# Patient Record
Sex: Female | Born: 1960 | Race: White | Hispanic: No | Marital: Married | Smoking: Never smoker
Health system: Southern US, Community
[De-identification: ages and names within clinical notes are randomized; demographics above are authoritative.]

## PROBLEM LIST (undated history)

## (undated) DIAGNOSIS — I471 Supraventricular tachycardia, unspecified: Secondary | ICD-10-CM

## (undated) DIAGNOSIS — K219 Gastro-esophageal reflux disease without esophagitis: Secondary | ICD-10-CM

## (undated) HISTORY — PX: TOTAL ABDOMINAL HYSTERECTOMY W/ BILATERAL SALPINGOOPHORECTOMY: SHX83

## (undated) HISTORY — DX: Supraventricular tachycardia: I47.1

## (undated) HISTORY — DX: Supraventricular tachycardia, unspecified: I47.10

## (undated) HISTORY — DX: Gastro-esophageal reflux disease without esophagitis: K21.9

---

## 2006-02-17 ENCOUNTER — Emergency Department (HOSPITAL_COMMUNITY): Admission: EM | Admit: 2006-02-17 | Discharge: 2006-02-17 | Payer: Self-pay | Admitting: Emergency Medicine

## 2006-03-05 ENCOUNTER — Ambulatory Visit: Payer: Self-pay | Admitting: Cardiology

## 2006-03-13 ENCOUNTER — Encounter: Payer: Self-pay | Admitting: Internal Medicine

## 2006-03-13 ENCOUNTER — Ambulatory Visit: Payer: Self-pay

## 2006-09-02 ENCOUNTER — Ambulatory Visit: Payer: Self-pay | Admitting: Cardiology

## 2007-08-18 ENCOUNTER — Ambulatory Visit: Payer: Self-pay | Admitting: Cardiology

## 2007-11-20 IMAGING — CR DG CHEST 1V PORT
1 series · 1 of 1 positions shown · non-contrast
Comparison: none

HISTORY: Chest pain, tachycardia

PORTABLE CHEST ONE VIEW:
Portable exam 5608 hours without priors for comparison.
Upper normal heart size.
Normal mediastinal contours and pulmonary vascularity.
Lungs clear.
Cardiac monitoring lines project over chest.
Bones unremarkable.

[view not recorded]
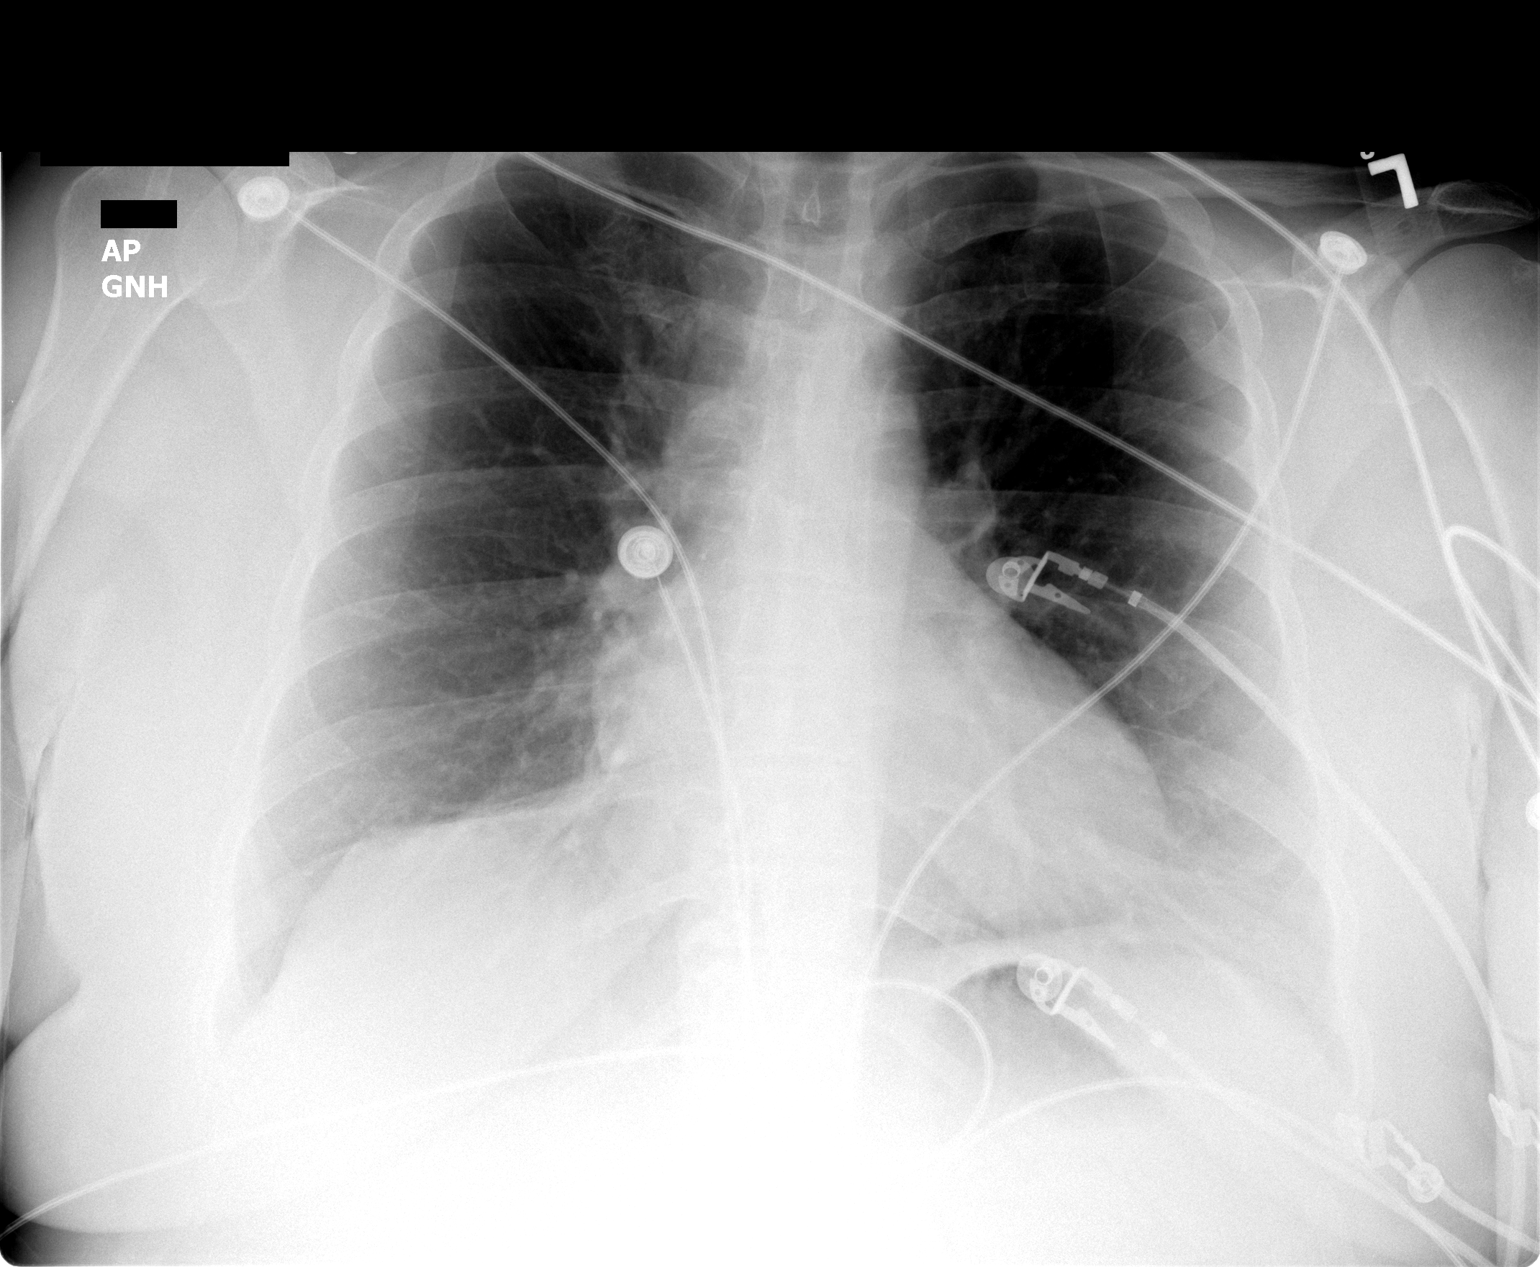

[1 of 1 positions shown; findings below may reference images not displayed]

IMPRESSION: No acute abnormalities.

## 2009-01-31 ENCOUNTER — Encounter (INDEPENDENT_AMBULATORY_CARE_PROVIDER_SITE_OTHER): Payer: Self-pay | Admitting: *Deleted

## 2009-02-03 ENCOUNTER — Ambulatory Visit: Payer: Self-pay | Admitting: Cardiology

## 2009-02-03 DIAGNOSIS — I498 Other specified cardiac arrhythmias: Secondary | ICD-10-CM | POA: Insufficient documentation

## 2009-04-03 ENCOUNTER — Telehealth (INDEPENDENT_AMBULATORY_CARE_PROVIDER_SITE_OTHER): Payer: Self-pay | Admitting: *Deleted

## 2010-04-17 NOTE — Progress Notes (Signed)
  Phone Note Other Incoming   Caller: [facility] Action Taken: Information Sent Initial call taken by: Marijean Niemann EKG to [phone] [facility]  April 03, 2009 11:49 AM

## 2010-06-06 ENCOUNTER — Emergency Department (HOSPITAL_COMMUNITY): Payer: BC Managed Care – PPO

## 2010-06-06 ENCOUNTER — Encounter (INDEPENDENT_AMBULATORY_CARE_PROVIDER_SITE_OTHER): Payer: BC Managed Care – PPO

## 2010-06-06 ENCOUNTER — Emergency Department (HOSPITAL_COMMUNITY)
Admission: EM | Admit: 2010-06-06 | Discharge: 2010-06-06 | Disposition: A | Discharge status: Home / Self Care | Payer: BC Managed Care – PPO | Attending: Emergency Medicine | Admitting: Emergency Medicine

## 2010-06-06 ENCOUNTER — Telehealth: Payer: Self-pay | Admitting: Cardiology

## 2010-06-06 DIAGNOSIS — R110 Nausea: Secondary | ICD-10-CM | POA: Insufficient documentation

## 2010-06-06 DIAGNOSIS — R0789 Other chest pain: Secondary | ICD-10-CM | POA: Insufficient documentation

## 2010-06-06 DIAGNOSIS — I472 Ventricular tachycardia, unspecified: Secondary | ICD-10-CM

## 2010-06-06 DIAGNOSIS — R42 Dizziness and giddiness: Secondary | ICD-10-CM | POA: Insufficient documentation

## 2010-06-06 DIAGNOSIS — R5381 Other malaise: Secondary | ICD-10-CM | POA: Insufficient documentation

## 2010-06-06 DIAGNOSIS — R5383 Other fatigue: Secondary | ICD-10-CM | POA: Insufficient documentation

## 2010-06-06 DIAGNOSIS — R000 Tachycardia, unspecified: Secondary | ICD-10-CM | POA: Insufficient documentation

## 2010-06-06 LAB — COMPREHENSIVE METABOLIC PANEL
ALT: 15 U/L (ref 0–35)
AST: 18 U/L (ref 0–37)
Calcium: 9.5 mg/dL (ref 8.4–10.5)
Creatinine, Ser: 0.86 mg/dL (ref 0.4–1.2)
GFR calc Af Amer: >60 mL/min (ref >60)
GFR calc non Af Amer: >60 mL/min (ref >60)
Potassium: 4.1 mEq/L (ref 3.5–5.1)
Sodium: 136 mEq/L (ref 135–145)
Total Protein: 7.5 g/dL (ref 6.0–8.3)

## 2010-06-06 LAB — CBC
HCT: 37.9 % (ref 36.0–46.0)
Hemoglobin: 12.2 g/dL (ref 12.0–15.0)
MCHC: 32.2 g/dL (ref 30.0–36.0)
RBC: 5.02 MIL/uL (ref 3.87–5.11)
RDW: 16.6 % — ABNORMAL HIGH (ref 11.5–15.5)

## 2010-06-06 LAB — DIFFERENTIAL
Eosinophils Absolute: 0.1 10*3/uL (ref 0.0–0.7)
Monocytes Relative: 4 % (ref 3–12)
Neutro Abs: 5.9 10*3/uL (ref 1.7–7.7)
Neutrophils Relative %: 75 % (ref 43–77)

## 2010-06-06 LAB — LIPASE, BLOOD: Lipase: 25 U/L (ref 11–59)

## 2010-06-06 NOTE — Telephone Encounter (Signed)
Patient came to office and was sitting in parking lot per Glynda Jaeger in scheduling.  Patient had started to have chest pain as well as other symptoms.  Advised per Dennis Bast to go to ED.

## 2010-06-20 ENCOUNTER — Encounter: Payer: Self-pay | Admitting: Physician Assistant

## 2010-06-20 ENCOUNTER — Ambulatory Visit (INDEPENDENT_AMBULATORY_CARE_PROVIDER_SITE_OTHER): Payer: BC Managed Care – PPO | Admitting: Physician Assistant

## 2010-06-20 DIAGNOSIS — R079 Chest pain, unspecified: Secondary | ICD-10-CM | POA: Insufficient documentation

## 2010-06-20 DIAGNOSIS — I498 Other specified cardiac arrhythmias: Secondary | ICD-10-CM

## 2010-06-20 MED ORDER — PROPRANOLOL HCL 10 MG PO TABS: 10.0000 mg | ORAL_TABLET | Freq: Two times a day (BID) | ORAL | Status: DC | PRN

## 2010-06-20 NOTE — Progress Notes (Signed)
Exercise Treadmill Test  Pre-Exercise Testing Evaluation Rhythm: normal sinus  Rate: 80   PR:  .13 QRS:  .08  QT:  .37 QTc: .43     Test  Exercise Tolerance Test Ordering MD: Marca Ancona, MD  Interpreting MD:  Tereso Newcomer PA-C  Unique Test No: 1  Treadmill:  1  Indication for ETT: Tachycardia  Contraindication to ETT: No   Stress Modality: exercise - treadmill  Cardiac Imaging Performed: non   Protocol: standard Bruce - maximal  Max BP:  173/49  Max MPHR (bpm): 170 85% MPR (bpm):  144  MPHR obtained (bpm):  162 % MPHR obtained:  96  Reached 85% MPHR (min:sec):  3:20 Total Exercise Time (min-sec):  4:57  Workload in METS:  5.5 Borg Scale: 12  Reason ETT Terminated:  patient's desire to stop    ST Segment Analysis At Rest: normal ST segments - no evidence of significant ST depression With Exercise: non-specific ST changes  Other Information Arrhythmia:  No Angina during ETT:  absent (0) Quality of ETT:  diagnostic  ETT Interpretation:  normal - no evidence of ischemia by ST analysis  Comments: Poor exercise tolerance. Normal BP response to exercise. No chest pain. No significant ECG changes to suggest ischemia. Patient had to stop due to nausea.  She has had improved symptoms with taking anti-reflux medicines but continues to have a lot of nausea.  Recommendations: Follow up with PCP. Follow up with Dr. Shirlee Latch.

## 2010-07-12 ENCOUNTER — Encounter: Payer: BC Managed Care – PPO | Admitting: Cardiology

## 2010-07-13 ENCOUNTER — Encounter: Payer: Self-pay | Admitting: Cardiology

## 2010-07-13 ENCOUNTER — Encounter: Payer: Self-pay | Admitting: *Deleted

## 2010-07-16 ENCOUNTER — Ambulatory Visit (INDEPENDENT_AMBULATORY_CARE_PROVIDER_SITE_OTHER): Payer: BC Managed Care – PPO | Admitting: Cardiology

## 2010-07-16 ENCOUNTER — Encounter: Payer: Self-pay | Admitting: Cardiology

## 2010-07-16 DIAGNOSIS — I498 Other specified cardiac arrhythmias: Secondary | ICD-10-CM

## 2010-07-16 DIAGNOSIS — R079 Chest pain, unspecified: Secondary | ICD-10-CM

## 2010-07-16 NOTE — Progress Notes (Signed)
PCP: Dr. Levora Angel in [facility]  50 yo with history of SVT returns for cardiology followup.  Patient was seen in the ER 3/12 with shoulder pain as well as acidic belching and vomiting.  She felt a run of SVT during all this.  Cardiac enzymes were negative, she was in sinus rhythm in the ER.  After discharge, she had an ETT that was negative for ischemia.  She had below average exercise tolerance and had to stop because of nausea (had some prior to exercise, worsened with exercise).  3 week event monitor showed 2 short runs of SVT (AVNRT versus atrial tachycardia).  Main problem over the last few months has been epigastric burning and nausea.  The epigastric burning tends to come after meals.  The nausea occurs at any time.  Sometimes it occurred with exertion.  She has been on Protonix which has considerably improved these symptoms.  She occasionally feels short runs of SVT but nothing prolonged and no lightheadedness or syncope.  She just returned from a business trip to [place].  She had no problems while on the trip.    ECG: NSR, normal  Allergies:  No Known Drug Allergies  Past Medical History: 1. 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  Echo (12/07) with EF 65%, mild LAE.  Event monitor (3 wks) in 4/12 showed rare short runs of SVT: AVNRT versus atrial tachycardia.  No prolonged episodes.  2. GERD 3. ETT (4/12): No evidence for ischemia by ECG analysis.  Exercised 4'57" (below average), stopped with nausea.   Family History: Significant for some type of cardiac arrhythmia in the patient's mother, who lived to age 49.  Father with CAD at age 68.   Social History: Works as Airline pilot.  Lives in [facility] Married, no children Tobacco Use - No.  Alcohol Use - yes Regular Exercise - yes Drug Use - no  Review of Systems        All systems reviewed and negative except as per HPI.   Current Outpatient Prescriptions  Medication Sig Dispense Refill  . b complex  vitamins tablet Take 1 tablet by mouth daily.        Marland Kitchen estrogens, conjugated, (PREMARIN) 0.45 MG tablet Take 0.45 mg by mouth daily. Take daily for 21 days then do not take for 7 days.       Boris Lown Oil 300 MG CAPS Take 300 mg by mouth daily.        . Multiple Vitamin (MULTIVITAMIN) tablet Take 1 tablet by mouth daily.        . pantoprazole (PROTONIX) 40 MG tablet Take 40 mg by mouth daily.        . propranolol (INDERAL) 10 MG tablet Take 1 tablet (10 mg total) by mouth 2 (two) times daily as needed.  20 tablet  11  . DISCONTD: esomeprazole (NEXIUM) 40 MG capsule Take 40 mg by mouth daily before breakfast.          BP 137/85  Pulse 85  Resp 18  Ht 5\' 6"  (1.676 m)  Wt 242 lb (109.77 kg)  BMI 39.06 kg/m2 General: NAD, overweight Neck: No JVD, no thyromegaly or thyroid nodule.  Lungs: Clear to auscultation bilaterally with normal respiratory effort. CV: Nondisplaced PMI.  Heart regular S1/S2, no S3/S4, no murmur.  No peripheral edema.  No carotid bruit.  Normal pedal pulses.  Abdomen: Soft, nontender, no hepatosplenomegaly, no distention.   Neurologic: Alert and oriented x 3.  Psych: Normal affect. Extremities: No clubbing or cyanosis.

## 2010-07-16 NOTE — Patient Instructions (Signed)
Your physician wants you to follow-up in: 1 year with Dr McLean. (April 2013) You will receive a reminder letter in the mail two months in advance. If you don't receive a letter, please call our office to schedule the follow-up appointment.  

## 2010-07-16 NOTE — Assessment & Plan Note (Signed)
Actually, more epigastric burning.  Symptoms much improved with PPI.  Suspect GERD-related.  ETT showed no evidence for ischemia but exercise tolerance below average.  Suggested more regular exercise.

## 2010-07-16 NOTE — Assessment & Plan Note (Signed)
Patient continues to have rare, short runs of SVT.  Could be AVNRT versus atrial tachycardia based on the couple of short runs on her event monitor.  She is not very symptomatic with these, main symptoms seem to be coming from GERD.  We talked about going on a standing beta blocker, but for now she wants to continue prn propranolol which is reasonable.  If episodes become longer or more frequent, could start with standing beta blocker and proceed eventually to SVT ablation if needed.

## 2010-07-31 NOTE — Assessment & Plan Note (Signed)
[facility]                            CARDIOLOGY OFFICE NOTE   Stephanie Copeland, Stephanie Copeland                        MRN:          [id number]  DATE:09/02/2006                            DOB:          [birth year]    PRIMARY CARE PHYSICIAN:  Dr. Forrest Moron.   REASON FOR VISIT:  Followup paroxysmal supraventricular tachycardia.   HISTORY OF PRESENT ILLNESS:  I saw Stephanie Copeland back in December.  She  has a history of probable paroxysmal supraventricular tachycardia based  on available information.  Her resting electrocardiogram is normal with  sinus arrhythmia in followup.  She reports 1 brief, less than 10 minute  episode of tachy palpitations at a birthday party back in March and took  a p.r.n. propranolol as recommended last time.  This was effective and  she has had no other issues since then.  She is exercising regularly and  not having any exertional palpitations, limiting dyspnea, or  reproducible exertional chest pain.  She had a followup echocardiogram  done back in December, which demonstrated normal left ventricular  systolic function at 65% with no regional wall motion abnormalities.  She has had no dizziness or syncope.   ALLERGIES:  No known drug allergies.   PRESENT MEDICATIONS:  1. Multivitamin and calcium supplements daily.  2. She also has p.r.n. propranolol for tachy palpitations.   REVIEW OF SYSTEMS:  As described in the history of present illness.   EXAMINATION:  Blood pressure 125/83, heart rate is 71, weight is 226  pounds, down from 238.  She is comfortable and in no acute distress.  Examination of the neck reveals no elevated jugular venous pressure or  loud bruits.  No thyromegaly is noted.  LUNGS:  Clear.  No labored breathing at rest.  CARDIAC:  Regular rate and rhythm.  No S3 gallop or murmur.  EXTREMITIES:  No pitting edema.   IMPRESSION/RECOMMENDATIONS:  1. Paroxysmal supraventricular tachycardia.  Brief episode back in      March that resolved with p.r.n. propranolol.  Will continue with a      strategy of observation at this point.  I will plan to      see her back in 1 year's time.  2. Further plan is to follow.     Jonelle Sidle, MD  Electronically Signed    SGM/MedQ  DD: 09/02/2006  DT: 09/02/2006  Job #: [id number]   cc:   Forrest Moron

## 2010-07-31 NOTE — Assessment & Plan Note (Signed)
[facility]                            CARDIOLOGY OFFICE NOTE   MATY, ZEISLER                        MRN:          [id number]  DATE:08/18/2007                            DOB:          Jul 31, 1960    PRIMARY CARE PHYSICIAN:  Forrest Moron, M.D.   REASON FOR VISIT:  Paroxysmal supraventricular tachycardia.   HISTORY OF PRESENT ILLNESS:  Ms. Stephanie Copeland comes in for an annual visit.  She continues to do very well.  She reports no palpitations whatsoever  since I last saw her.  She has not had to use any p.r.n. propranolol.  Electrocardiogram today is normal showing sinus rhythm at 92 beats per  minute.  She otherwise is not limited by any exertional chest pain or  dyspnea.   ALLERGIES:  No known drug allergies.   PRESENT MEDICATIONS:  Multivitamin daily, calcium supplements, and  p.r.n., propranolol.   REVIEW OF SYSTEMS:  As in History of Present Illness, otherwise  negative.   PHYSICAL EXAMINATION:  VITAL SIGNS:  Blood pressure 140./84, heart rate  92.  Weight is 240 pounds.  GENERAL:  The patient is comfortable in no acute distress.  NECK:  Examination of the neck reveals no elevated jugular venous  pressure, no bruits.  LUNGS:  Clear without labored breathing.  CARDIAC:  Exam reveals a regular rate and rhythm with no loud murmur or  gallop.  ABDOMEN:  Soft, nontender.  EXTREMITIES:  No pitting edema.   IMPRESSION AND RECOMMENDATIONS:  History of paroxysmal supraventricular  tachycardia.  Ms. Huettner is doing very well at this time with no  breakthrough episodes over the last year and a half.  I have recommended  that she continue the present strategy using propranolol as needed.  She  will continue to see Dr. Levora Angel and can come back to see [place] if her  symptoms worsen or become more frequent.  She was very comfortable with  this.     Jonelle Sidle, MD  Electronically Signed    SGM/MedQ  DD: 08/18/2007  DT: 08/18/2007  Job #:  [id number]   cc:   Forrest Moron

## 2010-08-03 NOTE — Assessment & Plan Note (Signed)
[facility]                            CARDIOLOGY OFFICE NOTE   Stephanie Copeland, Stephanie Copeland                        MRN:          [id number]  DATE:03/05/2006                            DOB:          04-20-1960    REFERRING PHYSICIAN:  Lear Ng, MD   PRIMARY CARE PHYSICIAN:  Dr. Aleda Grana in [facility].   REASON FOR CONSULTATION:  Paroxysmal supraventricular tachycardia.   HISTORY OF PRESENT ILLNESS:  Stephanie Copeland is a pleasant 50 year old woman  with available history indicating paroxysmal supraventricular  tachycardia, at least over the last seven years.  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  She did not require chemical  cardioversion at that time but was evaluated in the emergency department  and apparently underwent further cardiac testing, including a stress  test and echocardiogram, that were reportedly reassuring.  She has had  recurrent episodes since then, but typically, these last only for, at  most, 20 minutes, and she is able to terminate this by drinking cold  water or bearing down.  More recently, she was seen in the emergency  department on the 3rd of this month with a more prolonged episode that  lasted approximately one hour and required 6 mg of Adenosine  intravenously for conversion to sinus rhythm.  Unfortunately, I do not  have the actual strips, but the emergency department report indicates  supraventricular tachycardia at 204 beats per minute.  I do have an  electrocardiogram report from December 3rd that shows sinus rhythm with  a ventricular couplet and nonspecific ST-T wave changes.  Her tracing  today is essentially normal, showing sinus rhythm at 87 beats per minute  with some motion artifact.  Blood work during her hospital evaluation  revealed  normal magnesium, normal potassium, normal CBC.  She is not on  any AV nodal blocking drugs at this time but tells me that she did take  Toprol XL for approximately one year's time and did not notice any  particular difference in her symptoms.  She feels occasional  palpitations, perhaps once a week, but this is usually for just a few  seconds.  Today we talked about the pathophysiology of AV nodal re-  entrant supraventricular tachycardia, which I presume is the underlying  diagnosis at this point, and the various treatment options.   DRUG ALLERGIES:  No known drug allergies.   CURRENT MEDICATIONS:  Multivitamin 1 p.o. daily.  Calcium supplements.   PAST MEDICAL HISTORY:  As outlined in the history of present illness.   SOCIAL HISTORY:  Patient is married.  Has no children.  She works as an  Airline pilot.  She denies any tobacco use.  Drinks 1-2 alcoholic beverages  a week.  She exercises by walking and doing weight training.  Drinks  caffeinated beverages daily.   FAMILY HISTORY:  Significant for some type of cardiac arrhythmia in the  patient's mother, who  lived to age 50.  Her father had a heart attack  at age 50, having developed heart disease at age 50   REVIEW OF SYSTEMS:  As described in the history of present illness.  She  had childhood asthma.  She does experience some reflux symptoms with  hiatal hernia and has menstrual dysfunction.   PHYSICAL EXAMINATION:  VITAL SIGNS:  Blood pressure is 122/84, heart  rate 87. Weighs 215 pounds.  GENERAL:  Patient is comfortable in no acute distress.  HEENT:  Conjunctivae and lids normal.  Pharynx is clear.  NECK:  Supple without elevated jugular venous distention.  No bruits.  No thyromegaly is noted.  LUNGS:  Clear without labored breathing at rest.  CARDIAC:  Regular rate and rhythm without S3 gallop or pericardial rub.  There is no loud murmur.  ABDOMEN:  Soft and nontender.  Normoactive bowel sounds.  EXTREMITIES:  No  significant pitting edema.  Distal pulses are 2+.  SKIN:  Warm and dry.  No ulcerative changes noted.  MUSCULOSKELETAL:  No kyphosis is noted.  NEUROPSYCHIATRIC:  Patient is alert and oriented x3.  Affect is normal.   IMPRESSION/RECOMMENDATIONS:  1. Presumed paroxysmal supraventricular tachycardia.  Resting      electrocardiogram is essentially normal, so I would presume a      concealed pathway, perhaps atrioventricular nodal reentrance      tachycardia.  I will try to obtain any telemetry strips from the      patient's emergency department stay for review.  In the meanwhile,      we will plan a 2D echocardiogram to assess cardiac structure and      function, and I have also provided her with a prescription for      p.r.n. propranolol.  If she has worsening symptoms, we may need to      consider other options.  I will plan to see her back over the next      six months for symptom review.  2. Further plans to follow.     Jonelle Sidle, MD  Electronically Signed    SGM/MedQ  DD: 03/05/2006  DT: 03/05/2006  Job #: [id number]   cc:   Aleda Grana, M.D.

## 2011-07-16 ENCOUNTER — Encounter: Payer: Self-pay | Admitting: Cardiology

## 2011-07-16 ENCOUNTER — Ambulatory Visit (INDEPENDENT_AMBULATORY_CARE_PROVIDER_SITE_OTHER): Payer: BC Managed Care – PPO | Admitting: Cardiology

## 2011-07-16 VITALS — BP 120/80 | HR 72 | Ht 66.0 in | Wt 183.0 lb

## 2011-07-16 DIAGNOSIS — I498 Other specified cardiac arrhythmias: Secondary | ICD-10-CM

## 2011-07-16 MED ORDER — PROPRANOLOL HCL 10 MG PO TABS: 10.0000 mg | ORAL_TABLET | Freq: Two times a day (BID) | ORAL | Status: DC | PRN

## 2011-07-16 NOTE — Patient Instructions (Signed)
You do not need to schedule a follow-up appointment with Dr McLean. 

## 2011-07-16 NOTE — Progress Notes (Signed)
PCP: Dr. Levora Angel in [facility]  51 yo with history of SVT returns for cardiology followup.  Patient was seen in the ER 3/12 with shoulder pain as well as acidic belching and vomiting.  She felt a run of SVT during all this.  Cardiac enzymes were negative, she was in sinus rhythm in the ER.  After discharge, she had an ETT that was negative for ischemia.  She had below average exercise tolerance and had to stop because of nausea (had some prior to exercise, worsened with exercise).  3 week event monitor showed 2 short runs of SVT (AVNRT versus atrial tachycardia).    Since I last saw her about a year ago, she has had only one episode consistent with SVT.  This resolved after taking propranolol.  No lightheadedness or syncope.  She has cut back on caffeine to 2 cups of coffee a day.  She has lost weight with improvement in her reflux symptoms.  No chest pain or exertional dyspnea.    ECG: NSR, normal  Allergies:  No Known Drug Allergies  Past Medical History: 1. 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  Echo (12/07) with EF 65%, mild LAE.  Event monitor (3 wks) in 4/12 showed rare short runs of SVT: AVNRT versus atrial tachycardia.  No prolonged episodes.  2. GERD 3. ETT (4/12): No evidence for ischemia by ECG analysis.  Exercised 4'57" (below average), stopped with nausea.   Family History: Significant for some type of cardiac arrhythmia in the patient's mother, who lived to age 68.  Father with CAD at age 84.   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  Alcohol Use - yes Regular Exercise - yes Drug Use - no   Current Outpatient Prescriptions  Medication Sig Dispense Refill  . b complex vitamins tablet Take 1 tablet by mouth daily.        Marland Kitchen estrogens, conjugated, (PREMARIN) 0.45 MG tablet Take 0.45 mg by mouth daily. Take daily for 21 days then do not take for 7 days.       . Multiple Vitamin (MULTIVITAMIN)  tablet Take 1 tablet by mouth daily.        . pantoprazole (PROTONIX) 40 MG tablet Take 40 mg by mouth daily.        . propranolol (INDERAL) 10 MG tablet Take 1 tablet (10 mg total) by mouth 2 (two) times daily as needed.  30 tablet  11  . DISCONTD: propranolol (INDERAL) 10 MG tablet Take 1 tablet (10 mg total) by mouth 2 (two) times daily as needed.  20 tablet  11    BP 120/80  Pulse 72  Ht 5\' 6"  (1.676 m)  Wt 183 lb (83.008 kg)  BMI 29.54 kg/m2 General: NAD, overweight Neck: No JVD, no thyromegaly or thyroid nodule.  Lungs: Clear to auscultation bilaterally with normal respiratory effort. CV: Nondisplaced PMI.  Heart regular S1/S2, no S3/S4, no murmur.  No peripheral edema.  No carotid bruit.  Normal pedal pulses.  Abdomen: Soft, nontender, no hepatosplenomegaly, no distention.   Neurologic: Alert and oriented x 3.  Psych: Normal affect. Extremities: No clubbing or cyanosis.

## 2011-07-16 NOTE — Assessment & Plan Note (Signed)
Patient continues to have rare, short runs of SVT.  Could be AVNRT versus atrial tachycardia based on the couple of short runs on her prior event monitor.   We talked in the past about going on a standing beta blocker, but for now she wants to continue prn propranolol which is reasonable given the infrequency of the episodes.  If episodes become longer or more frequent or symptomatic, could start with standing beta blocker and proceed eventually to SVT ablation if needed.  I will see her back as needed.

## 2012-03-08 IMAGING — CR DG CHEST 2V
2 series · 2 of 2 positions shown · non-contrast
Comparison: None.

CLINICAL DATA: Chest pain

CHEST - 2 VIEW

[w chest pa]
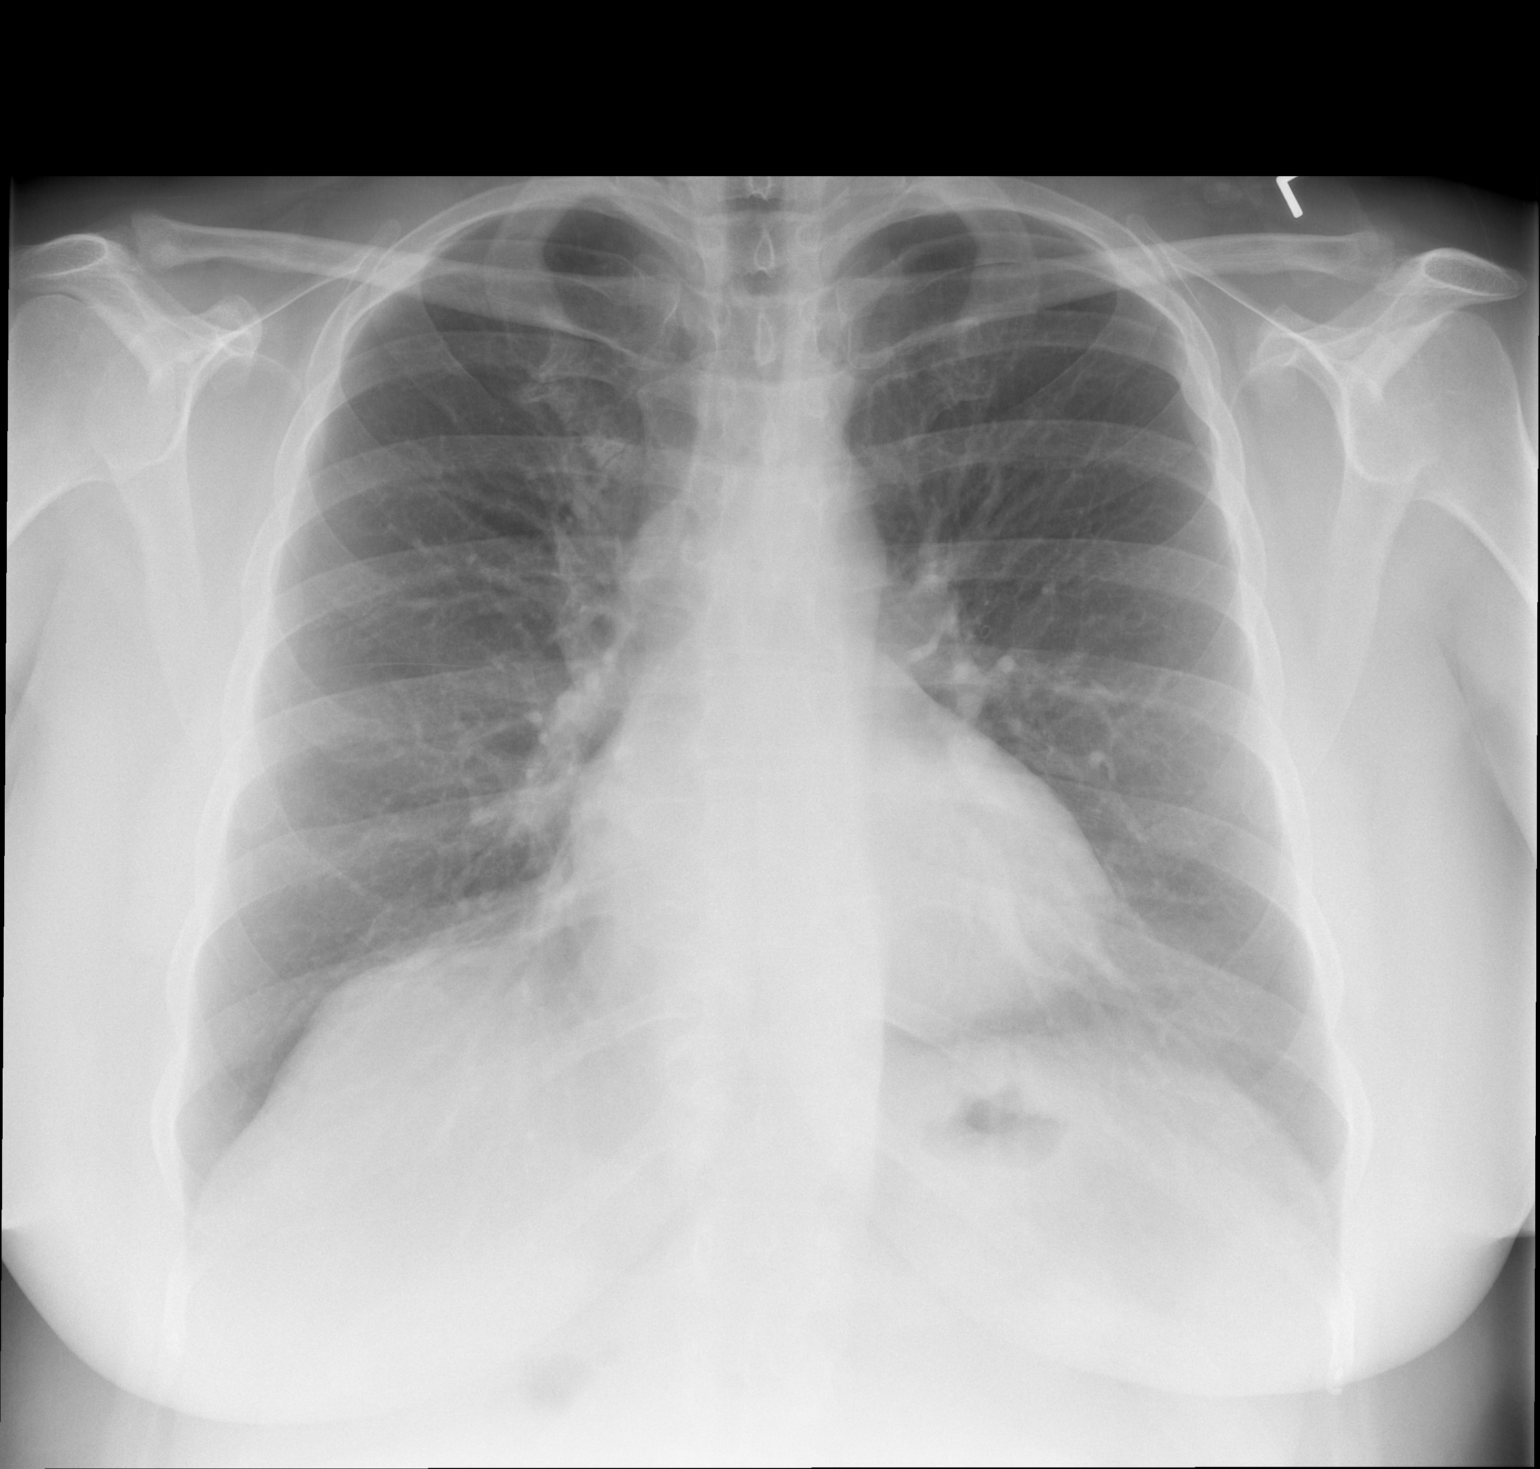

[w chest lat]
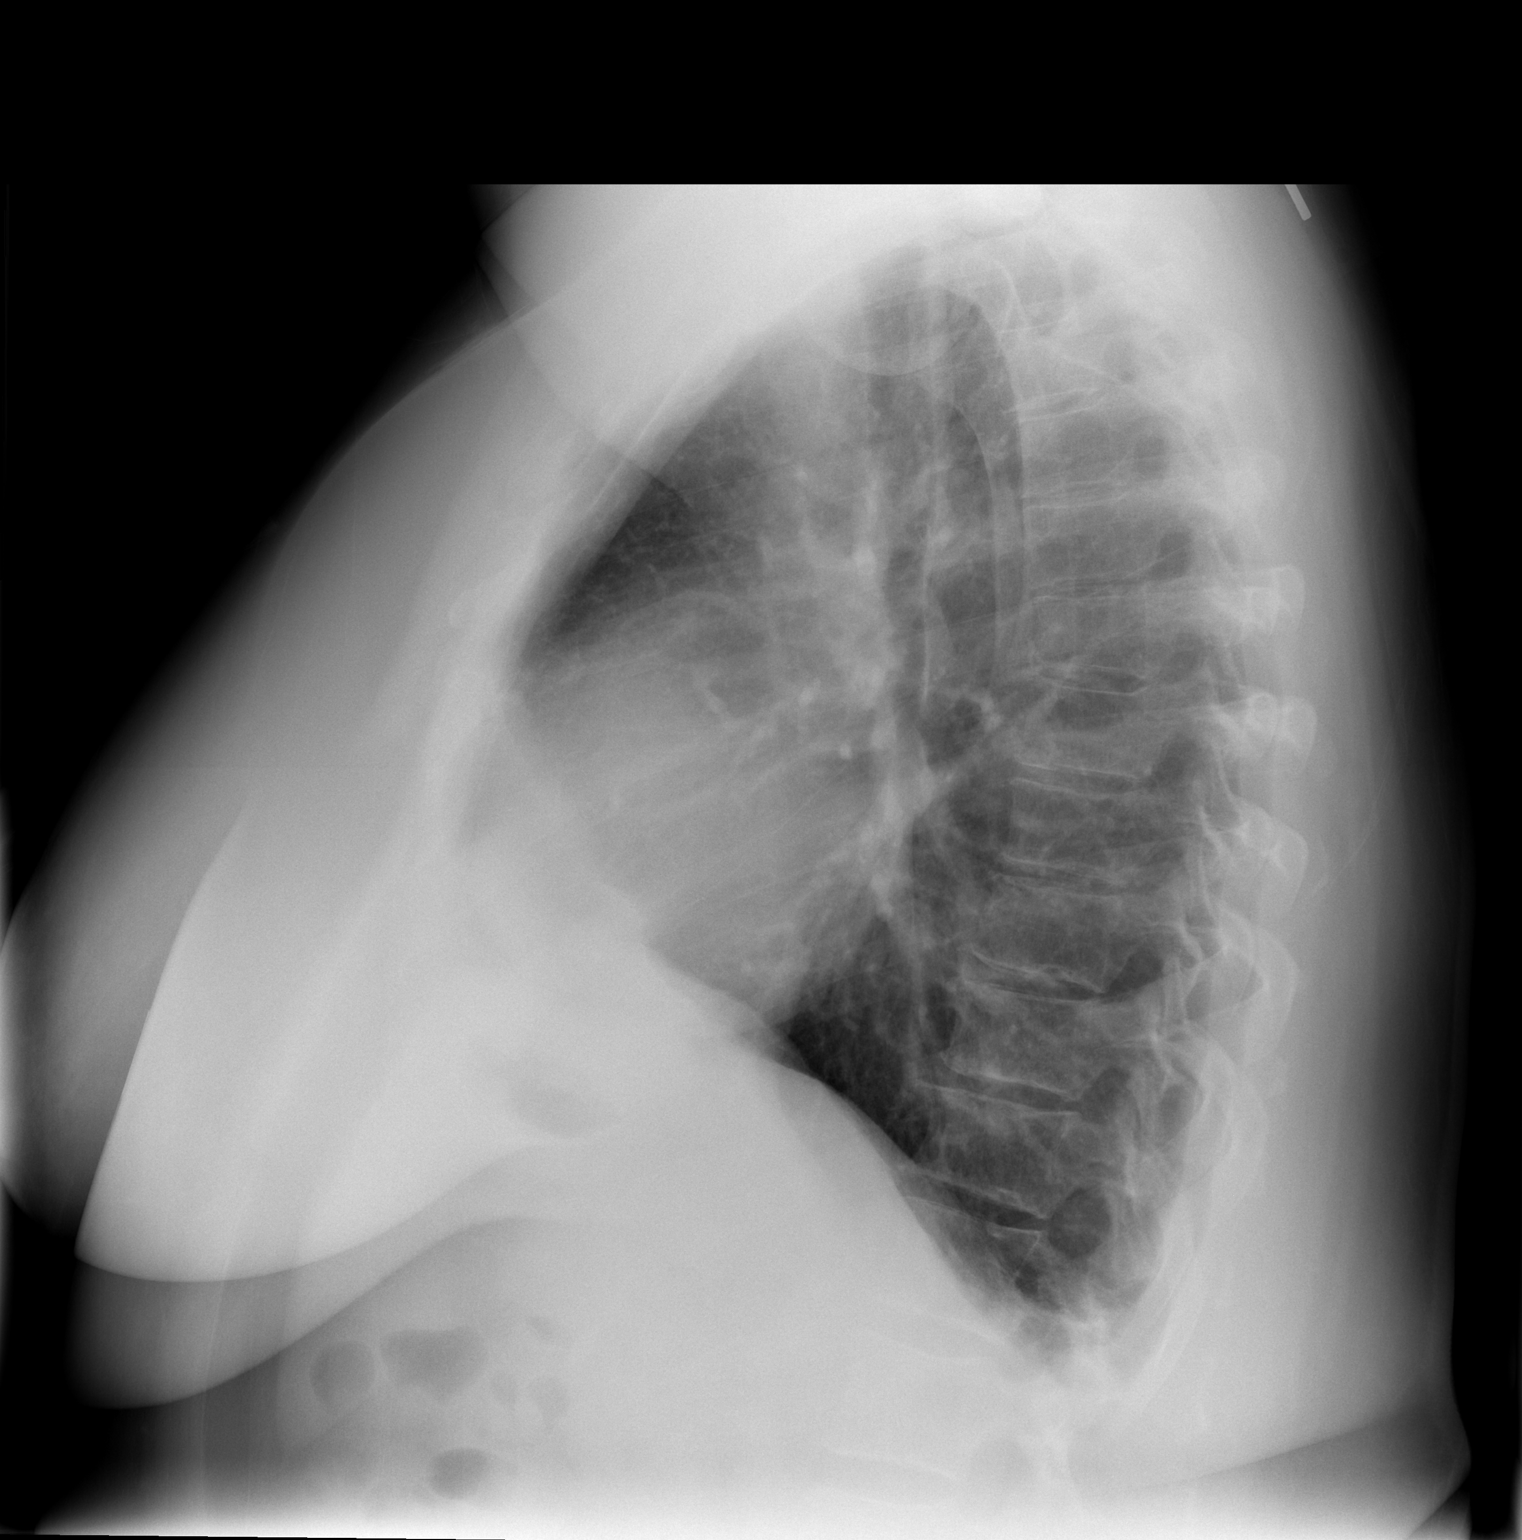

[2 of 2 positions shown; findings below may reference images not displayed]

FINDINGS: Heart and mediastinal contours normal.  Lungs clear.  No
pleural fluid.  Osseous structures and soft tissues unremarkable.
IMPRESSION: No acute or significant findings.

## 2013-04-02 ENCOUNTER — Encounter: Payer: Self-pay | Admitting: Cardiology

## 2013-08-04 ENCOUNTER — Encounter: Payer: Self-pay | Admitting: Cardiology

## 2014-01-06 ENCOUNTER — Other Ambulatory Visit: Payer: Self-pay

## 2014-01-06 ENCOUNTER — Telehealth: Payer: Self-pay

## 2014-01-06 MED ORDER — PROPRANOLOL HCL 10 MG PO TABS: 10.0000 mg | ORAL_TABLET | Freq: Two times a day (BID) | ORAL | Status: DC | PRN

## 2014-01-06 NOTE — Telephone Encounter (Signed)
That would be fine 

## 2016-04-15 ENCOUNTER — Ambulatory Visit (INDEPENDENT_AMBULATORY_CARE_PROVIDER_SITE_OTHER): Payer: 59 | Admitting: Podiatry

## 2016-04-15 ENCOUNTER — Encounter: Payer: Self-pay | Admitting: Podiatry

## 2016-04-15 VITALS — BP 130/80 | HR 101

## 2016-04-15 DIAGNOSIS — B351 Tinea unguium: Secondary | ICD-10-CM

## 2016-04-15 DIAGNOSIS — L608 Other nail disorders: Secondary | ICD-10-CM

## 2016-04-15 DIAGNOSIS — M79609 Pain in unspecified limb: Secondary | ICD-10-CM

## 2016-04-15 DIAGNOSIS — L603 Nail dystrophy: Secondary | ICD-10-CM

## 2016-04-15 NOTE — Progress Notes (Signed)
   Subjective:    Patient ID: Stephanie Copeland, female    DOB: 1960/08/19, 56 y.o.   MRN: [id number]  HPI  Chief Complaint  Patient presents with  . Nail Problem    Left; Great toe; Medial side. Pt states that the toe was painful, red and swollen on Thursday and Friday last week. Not having pain today.         Review of Systems     Objective:   Physical Exam        Assessment & Plan:

## 2016-04-26 NOTE — Progress Notes (Signed)
Patient ID: Stephanie Copeland, female   DOB: 10/03/1960, 56 y.o.   MRN: [id number]   Subjective:  56 year old female patient presents today as a new patient for evaluation of left great toe pain 1 day. Patient states that the toe was painful red and swollen last week however she is not having pain today. Patient also complains of fungal nails to the bilateral lower extremities. The fungal nails do not seem to be of concern for her at this moment.    Objective/Physical Exam General: The patient is alert and oriented x3 in no acute distress.  Dermatology: Mildly elongated, thickened, discolored nails noted 1-5 bilateral. Skin is warm, dry and supple bilateral lower extremities. Negative for open lesions or macerations.  Vascular: Palpable pedal pulses bilaterally. No edema or erythema noted. Capillary refill within normal limits.  Neurological: Epicritic and protective threshold grossly intact bilaterally.   Musculoskeletal Exam: Range of motion within normal limits to all pedal and ankle joints bilateral. Muscle strength 5/5 in all groups bilateral.   Assessment: #1 history of left great toe pain 1 day #2 onychomycosis   Plan of Care:  #1 Patient was evaluated. #2 since the left great toe no longer hurts and is no longer red and painful, there is no treatment warranted #3 onychomycosis does not bother the patient however we will not proceed with treatment. #4 return to clinic when necessary   Felecia ShellingBrent M. Evans, DPM [facility]  Dr. Felecia ShellingBrent M. Evans, DPM    [address]                                        [place], [place] [id number]                Office [phone]  Fax [phone]

## 2019-05-22 ENCOUNTER — Ambulatory Visit: Payer: Self-pay
